# Patient Record
Sex: Female | Born: 1970 | Race: Black or African American | Hispanic: No | Marital: Single | Smoking: Never smoker
Health system: Southern US, Community
[De-identification: ages and names within clinical notes are randomized; demographics above are authoritative.]

## PROBLEM LIST (undated history)

## (undated) HISTORY — PX: INDUCED ABORTION: SHX677

---

## 2014-02-09 ENCOUNTER — Encounter (HOSPITAL_BASED_OUTPATIENT_CLINIC_OR_DEPARTMENT_OTHER): Payer: Self-pay | Admitting: Emergency Medicine

## 2014-02-09 ENCOUNTER — Emergency Department (HOSPITAL_BASED_OUTPATIENT_CLINIC_OR_DEPARTMENT_OTHER)
Admission: EM | Admit: 2014-02-09 | Discharge: 2014-02-09 | Disposition: A | Discharge status: Home / Self Care | Payer: Self-pay | Attending: Emergency Medicine | Admitting: Emergency Medicine

## 2014-02-09 ENCOUNTER — Emergency Department (HOSPITAL_BASED_OUTPATIENT_CLINIC_OR_DEPARTMENT_OTHER): Payer: Self-pay

## 2014-02-09 DIAGNOSIS — D649 Anemia, unspecified: Secondary | ICD-10-CM | POA: Insufficient documentation

## 2014-02-09 DIAGNOSIS — Z3202 Encounter for pregnancy test, result negative: Secondary | ICD-10-CM | POA: Insufficient documentation

## 2014-02-09 DIAGNOSIS — R109 Unspecified abdominal pain: Secondary | ICD-10-CM

## 2014-02-09 DIAGNOSIS — R112 Nausea with vomiting, unspecified: Secondary | ICD-10-CM | POA: Insufficient documentation

## 2014-02-09 DIAGNOSIS — K429 Umbilical hernia without obstruction or gangrene: Secondary | ICD-10-CM | POA: Insufficient documentation

## 2014-02-09 LAB — COMPREHENSIVE METABOLIC PANEL
ALT: 7 U/L (ref 0–35)
AST: 12 U/L (ref 0–37)
Albumin: 3.9 g/dL (ref 3.5–5.2)
Alkaline Phosphatase: 64 U/L (ref 39–117)
BUN: 6 mg/dL (ref 6–23)
CHLORIDE: 98 meq/L (ref 96–112)
CO2: 28 meq/L (ref 19–32)
CREATININE: 0.7 mg/dL (ref 0.50–1.10)
Calcium: 9.6 mg/dL (ref 8.4–10.5)
GFR calc Af Amer: >90 mL/min (ref >90)
GLUCOSE: 101 mg/dL — AB (ref 70–99)
Potassium: 3.2 mEq/L — ABNORMAL LOW (ref 3.7–5.3)
SODIUM: 139 meq/L (ref 137–147)
Total Bilirubin: 0.6 mg/dL (ref 0.3–1.2)
Total Protein: 7.8 g/dL (ref 6.0–8.3)

## 2014-02-09 LAB — CBC WITH DIFFERENTIAL/PLATELET
BASOS ABS: 0 10*3/uL (ref 0.0–0.1)
Basophils Relative: 0 % (ref 0–1)
EOS PCT: 0 % (ref 0–5)
Eosinophils Absolute: 0 10*3/uL (ref 0.0–0.7)
HEMATOCRIT: 29.4 % — AB (ref 36.0–46.0)
Hemoglobin: 8.5 g/dL — ABNORMAL LOW (ref 12.0–15.0)
LYMPHS ABS: 2.2 10*3/uL (ref 0.7–4.0)
Lymphocytes Relative: 28 % (ref 12–46)
MCH: 21.1 pg — ABNORMAL LOW (ref 26.0–34.0)
MCHC: 28.9 g/dL — ABNORMAL LOW (ref 30.0–36.0)
MCV: 73.1 fL — AB (ref 78.0–100.0)
Monocytes Absolute: 0.7 10*3/uL (ref 0.1–1.0)
Monocytes Relative: 9 % (ref 3–12)
NEUTROS ABS: 4.9 10*3/uL (ref 1.7–7.7)
Neutrophils Relative %: 63 % (ref 43–77)
Platelets: 571 10*3/uL — ABNORMAL HIGH (ref 150–400)
RBC: 4.02 MIL/uL (ref 3.87–5.11)
RDW: 17.8 % — AB (ref 11.5–15.5)
WBC: 7.8 10*3/uL (ref 4.0–10.5)

## 2014-02-09 LAB — URINALYSIS, ROUTINE W REFLEX MICROSCOPIC
Bilirubin Urine: NEGATIVE
GLUCOSE, UA: NEGATIVE mg/dL
HGB URINE DIPSTICK: NEGATIVE
KETONES UR: NEGATIVE mg/dL
Nitrite: NEGATIVE
PROTEIN: NEGATIVE mg/dL
Specific Gravity, Urine: 1.02 (ref 1.005–1.030)
Urobilinogen, UA: 1 mg/dL (ref 0.0–1.0)
pH: 5.5 (ref 5.0–8.0)

## 2014-02-09 LAB — URINE MICROSCOPIC-ADD ON

## 2014-02-09 LAB — LIPASE, BLOOD: Lipase: 26 U/L (ref 11–59)

## 2014-02-09 LAB — PREGNANCY, URINE: Preg Test, Ur: NEGATIVE

## 2014-02-09 MED ORDER — PROMETHAZINE HCL 25 MG PO TABS: 25.0000 mg | ORAL_TABLET | Freq: Four times a day (QID) | ORAL | Status: AC | PRN

## 2014-02-09 MED ORDER — HYDROMORPHONE HCL PF 1 MG/ML IJ SOLN
0.5000 mg | Freq: Once | INTRAMUSCULAR | Status: AC
  Administered 2014-02-09: 0.5 mg via INTRAVENOUS
  Filled 2014-02-09: qty 1

## 2014-02-09 MED ORDER — PROMETHAZINE HCL 25 MG RE SUPP: 25.0000 mg | Freq: Four times a day (QID) | RECTAL | Status: AC | PRN

## 2014-02-09 MED ORDER — PROMETHAZINE HCL 25 MG/ML IJ SOLN
25.0000 mg | Freq: Once | INTRAMUSCULAR | Status: AC
  Administered 2014-02-09: 25 mg via INTRAVENOUS
  Filled 2014-02-09: qty 1

## 2014-02-09 MED ORDER — SODIUM CHLORIDE 0.9 % IV BOLUS (SEPSIS)
1000.0000 mL | Freq: Once | INTRAVENOUS | Status: AC
  Administered 2014-02-09: 1000 mL via INTRAVENOUS

## 2014-02-09 NOTE — ED Provider Notes (Signed)
CSN: [id number]     Arrival date & time 02/09/14  1831 History   First MD Initiated Contact with Patient 02/09/14 1855     Chief Complaint  Patient presents with  . Abdominal Pain     (Consider location/radiation/quality/duration/timing/severity/associated sxs/prior Treatment) HPI Comments: Patent here with left flank and mid abdominal pain since this past Friday - she states that initially the pain was located around her umbilicus and her known umbilical hernia, she states she began then with nausea and vomiting.  She was seen at [facility] Saturday night for this, blood work revealed anemia and CT scan showed omentum and fat in the hernia with some stranding but no bowel.  She denies diarrhea or constipation.  She also denies fever, chills, dysuria, hematuria, vaginal discharge or bleeding.  She reports no history of sick contacts, she was unable to get the zofran filled because of the cost and has not been taking the pain medication they gave her because she knew she would not be able to keep this down.  She reports that she has been able to keep down sips of gingerale and broth today but no solid food - she reports trying crackers this morning which she vomited.  She denies blood in the vomit.  Patient is a 43 y.o. female presenting with abdominal pain. The history is provided by the patient. No language interpreter was used.  Abdominal Pain   History reviewed. No pertinent past medical history. Past Surgical History  Procedure Laterality Date  . Cesarean section    . Induced abortion     History reviewed. No pertinent family history. History  Substance Use Topics  . Smoking status: Never Smoker   . Smokeless tobacco: Not on file  . Alcohol Use: No   OB History   Grav Para Term Preterm Abortions TAB SAB Ect Mult Living                 Review of Systems  Gastrointestinal: Positive for abdominal pain.  All other systems reviewed and are negative.      Allergies  Review of  patient's allergies indicates no known allergies.  Home Medications   Current Outpatient Rx  Name  Route  Sig  Dispense  Refill  . ondansetron (ZOFRAN) 4 MG tablet   Oral   Take 4 mg by mouth every 8 (eight) hours as needed for nausea or vomiting.         . promethazine (PHENERGAN) 25 MG suppository   Rectal   Place 1 suppository (25 mg total) rectally every 6 (six) hours as needed for nausea or vomiting.   12 each   0   . promethazine (PHENERGAN) 25 MG tablet   Oral   Take 1 tablet (25 mg total) by mouth every 6 (six) hours as needed for nausea or vomiting.   30 tablet   0    BP 130/70  Pulse 82  Temp(Src) 98.9 F (37.2 C) (Oral)  Resp 16  Ht 5\' 2"  (1.575 m)  Wt 247 lb (112.038 kg)  BMI 45.17 kg/m2  SpO2 99%  LMP 01/31/2014 Physical Exam  Nursing note and vitals reviewed. Constitutional: She is oriented to person, place, and time. She appears well-developed and well-nourished.  Uncomfortable appearing.  HENT:  Head: Normocephalic and atraumatic.  Right Ear: External ear normal.  Left Ear: External ear normal.  Nose: Nose normal.  Mouth/Throat: Oropharynx is clear and moist. No oropharyngeal exudate.  Eyes: Conjunctivae are normal. Pupils are equal,  round, and reactive to light. No scleral icterus.  Neck: Normal range of motion. Neck supple.  Cardiovascular: Normal rate, regular rhythm and normal heart sounds.  Exam reveals no gallop and no friction rub.   No murmur heard. Pulmonary/Chest: Effort normal and breath sounds normal. No respiratory distress. She has no wheezes. She has no rales. She exhibits no tenderness.  Abdominal: Soft. Bowel sounds are normal. She exhibits mass. She exhibits no distension. There is tenderness. There is CVA tenderness. There is no rebound and no guarding. A hernia is present. Hernia confirmed positive in the ventral area.    Musculoskeletal: Normal range of motion. She exhibits no edema and no tenderness.  Lymphadenopathy:    She  has no cervical adenopathy.  Neurological: She is alert and oriented to person, place, and time. She exhibits normal muscle tone. Coordination normal.  Skin: Skin is warm and dry. No rash noted. No erythema. No pallor.  Psychiatric: She has a normal mood and affect. Her behavior is normal. Judgment and thought content normal.    ED Course  Procedures (including critical care time) Labs Review Labs Reviewed  CBC WITH DIFFERENTIAL - Abnormal; Notable for the following:    Hemoglobin 8.5 (*)    HCT 29.4 (*)    MCV 73.1 (*)    MCH 21.1 (*)    MCHC 28.9 (*)    RDW 17.8 (*)    Platelets 571 (*)    All other components within normal limits  COMPREHENSIVE METABOLIC PANEL - Abnormal; Notable for the following:    Potassium 3.2 (*)    Glucose, Bld 101 (*)    All other components within normal limits  URINALYSIS, ROUTINE W REFLEX MICROSCOPIC - Abnormal; Notable for the following:    APPearance CLOUDY (*)    Leukocytes, UA SMALL (*)    All other components within normal limits  URINE MICROSCOPIC-ADD ON - Abnormal; Notable for the following:    Squamous Epithelial / LPF MANY (*)    Bacteria, UA FEW (*)    All other components within normal limits  LIPASE, BLOOD  PREGNANCY, URINE   Imaging Review Dg Abd Acute W/chest  02/09/2014   CLINICAL DATA:  Left lower quadrant pain  EXAM: ACUTE ABDOMEN SERIES (ABDOMEN 2 VIEW & CHEST 1 VIEW)  COMPARISON:  None.  FINDINGS: There is no evidence of dilated bowel loops or free intraperitoneal air. Residual contrast is identified within the colon. Marland Kitchen Heart size and mediastinal contours are stable. The heart size is enlarged. Both lungs are clear.  IMPRESSION: No bowel obstruction or free air.  No acute cardiopulmonary disease.   Electronically Signed   By: Sherian Rein M.D.   On: 02/09/2014 20:42    EKG Interpretation   None      Results for orders placed during the hospital encounter of 02/09/14  CBC WITH DIFFERENTIAL      Result Value Range   WBC  7.8  4.0 - 10.5 K/uL   RBC 4.02  3.87 - 5.11 MIL/uL   Hemoglobin 8.5 (*) 12.0 - 15.0 g/dL   HCT 16.1 (*) 09.6 - 04.5 %   MCV 73.1 (*) 78.0 - 100.0 fL   MCH 21.1 (*) 26.0 - 34.0 pg   MCHC 28.9 (*) 30.0 - 36.0 g/dL   RDW 40.9 (*) 81.1 - 91.4 %   Platelets 571 (*) 150 - 400 K/uL   Neutrophils Relative % 63  43 - 77 %   Lymphocytes Relative 28  12 - 46 %  Monocytes Relative 9  3 - 12 %   Eosinophils Relative 0  0 - 5 %   Basophils Relative 0  0 - 1 %   Neutro Abs 4.9  1.7 - 7.7 K/uL   Lymphs Abs 2.2  0.7 - 4.0 K/uL   Monocytes Absolute 0.7  0.1 - 1.0 K/uL   Eosinophils Absolute 0.0  0.0 - 0.7 K/uL   Basophils Absolute 0.0  0.0 - 0.1 K/uL  COMPREHENSIVE METABOLIC PANEL      Result Value Range   Sodium 139  137 - 147 mEq/L   Potassium 3.2 (*) 3.7 - 5.3 mEq/L   Chloride 98  96 - 112 mEq/L   CO2 28  19 - 32 mEq/L   Glucose, Bld 101 (*) 70 - 99 mg/dL   BUN 6  6 - 23 mg/dL   Creatinine, Ser 4.09  0.50 - 1.10 mg/dL   Calcium 9.6  8.4 - 81.1 mg/dL   Total Protein 7.8  6.0 - 8.3 g/dL   Albumin 3.9  3.5 - 5.2 g/dL   AST 12  0 - 37 U/L   ALT 7  0 - 35 U/L   Alkaline Phosphatase 64  39 - 117 U/L   Total Bilirubin 0.6  0.3 - 1.2 mg/dL   GFR calc non Af Amer >90  >90 mL/min   GFR calc Af Amer >90  >90 mL/min  LIPASE, BLOOD      Result Value Range   Lipase 26  11 - 59 U/L  URINALYSIS, ROUTINE W REFLEX MICROSCOPIC      Result Value Range   Color, Urine YELLOW  YELLOW   APPearance CLOUDY (*) CLEAR   Specific Gravity, Urine 1.020  1.005 - 1.030   pH 5.5  5.0 - 8.0   Glucose, UA NEGATIVE  NEGATIVE mg/dL   Hgb urine dipstick NEGATIVE  NEGATIVE   Bilirubin Urine NEGATIVE  NEGATIVE   Ketones, ur NEGATIVE  NEGATIVE mg/dL   Protein, ur NEGATIVE  NEGATIVE mg/dL   Urobilinogen, UA 1.0  0.0 - 1.0 mg/dL   Nitrite NEGATIVE  NEGATIVE   Leukocytes, UA SMALL (*) NEGATIVE  PREGNANCY, URINE      Result Value Range   Preg Test, Ur NEGATIVE  NEGATIVE  URINE MICROSCOPIC-ADD ON      Result Value  Range   Squamous Epithelial / LPF MANY (*) RARE   WBC, UA 3-6  <3 WBC/hpf   Bacteria, UA FEW (*) RARE   Dg Abd Acute W/chest  02/09/2014   CLINICAL DATA:  Left lower quadrant pain  EXAM: ACUTE ABDOMEN SERIES (ABDOMEN 2 VIEW & CHEST 1 VIEW)  COMPARISON:  None.  FINDINGS: There is no evidence of dilated bowel loops or free intraperitoneal air. Residual contrast is identified within the colon. Marland Kitchen Heart size and mediastinal contours are stable. The heart size is enlarged. Both lungs are clear.  IMPRESSION: No bowel obstruction or free air.  No acute cardiopulmonary disease.   Electronically Signed   By: Sherian Rein M.D.   On: 02/09/2014 20:42     MDM   Final diagnoses:  Anemia  Umbilical hernia  Abdominal pain    Patient here with left flank and abdominal pain - we have gotten results from [facility], besides the anemia and the hernia, I do not suspect appendicitis, cholecystitis, inflammatory bowel, kidney stone, pyelonephritis.  This is likely gastroenteritis, the patient has pain medication at home, she could not afford the zofran so I have written  for phenergan at this time.  She agrees with the plan and will follow up with her PCP and the surgeon she was referred to.    Izola Price Marisue Humble, [place] 02/09/14 2153

## 2014-02-09 NOTE — ED Provider Notes (Signed)
Medical screening examination/treatment/procedure(s) were performed by non-physician practitioner and as supervising physician I was immediately available for consultation/collaboration.  EKG Interpretation   None         Gracelynne Benedict, MD 02/09/14 2239 

## 2014-02-09 NOTE — ED Notes (Signed)
Pt reports abd pain seen at [facility] for same on sat ct scan neg

## 2014-02-09 NOTE — ED Notes (Signed)
Patient transported to X-ray 

## 2014-02-09 NOTE — ED Notes (Signed)
Patient transported to CT 

## 2014-02-09 NOTE — Discharge Instructions (Signed)
Abdominal Pain, Women °Abdominal (stomach, pelvic, or belly) pain can be caused by many things. It is important to tell your doctor: °· The location of the pain. °· Does it come and go or is it present all the time? °· Are there things that start the pain (eating certain foods, exercise)? °· Are there other symptoms associated with the pain (fever, nausea, vomiting, diarrhea)? °All of this is helpful to know when trying to find the cause of the pain. °CAUSES  °· Stomach: virus or bacteria infection, or ulcer. °· Intestine: appendicitis (inflamed appendix), regional ileitis (Crohn's disease), ulcerative colitis (inflamed colon), irritable bowel syndrome, diverticulitis (inflamed diverticulum of the colon), or cancer of the stomach or intestine. °· Gallbladder disease or stones in the gallbladder. °· Kidney disease, kidney stones, or infection. °· Pancreas infection or cancer. °· Fibromyalgia (pain disorder). °· Diseases of the female organs: °· Uterus: fibroid (non-cancerous) tumors or infection. °· Fallopian tubes: infection or tubal pregnancy. °· Ovary: cysts or tumors. °· Pelvic adhesions (scar tissue). °· Endometriosis (uterus lining tissue growing in the pelvis and on the pelvic organs). °· Pelvic congestion syndrome (female organs filling up with blood just before the menstrual period). °· Pain with the menstrual period. °· Pain with ovulation (producing an egg). °· Pain with an IUD (intrauterine device, birth control) in the uterus. °· Cancer of the female organs. °· Functional pain (pain not caused by a disease, may improve without treatment). °· Psychological pain. °· Depression. °DIAGNOSIS  °Your doctor will decide the seriousness of your pain by doing an examination. °· Blood tests. °· X-rays. °· Ultrasound. °· CT scan (computed tomography, special type of X-ray). °· MRI (magnetic resonance imaging). °· Cultures, for infection. °· Barium enema (dye inserted in the large intestine, to better view it with  X-rays). °· Colonoscopy (looking in intestine with a lighted tube). °· Laparoscopy (minor surgery, looking in abdomen with a lighted tube). °· Major abdominal exploratory surgery (looking in abdomen with a large incision). °TREATMENT  °The treatment will depend on the cause of the pain.  °· Many cases can be observed and treated at home. °· Over-the-counter medicines recommended by your caregiver. °· Prescription medicine. °· Antibiotics, for infection. °· Birth control pills, for painful periods or for ovulation pain. °· Hormone treatment, for endometriosis. °· Nerve blocking injections. °· Physical therapy. °· Antidepressants. °· Counseling with a psychologist or psychiatrist. °· Minor or major surgery. °HOME CARE INSTRUCTIONS  °· Do not take laxatives, unless directed by your caregiver. °· Take over-the-counter pain medicine only if ordered by your caregiver. Do not take aspirin because it can cause an upset stomach or bleeding. °· Try a clear liquid diet (broth or water) as ordered by your caregiver. Slowly move to a bland diet, as tolerated, if the pain is related to the stomach or intestine. °· Have a thermometer and take your temperature several times a day, and record it. °· Bed rest and sleep, if it helps the pain. °· Avoid sexual intercourse, if it causes pain. °· Avoid stressful situations. °· Keep your follow-up appointments and tests, as your caregiver orders. °· If the pain does not go away with medicine or surgery, you may try: °· Acupuncture. °· Relaxation exercises (yoga, meditation). °· Group therapy. °· Counseling. °SEEK MEDICAL CARE IF:  °· You notice certain foods cause stomach pain. °· Your home care treatment is not helping your pain. °· You need stronger pain medicine. °· You want your IUD removed. °· You feel faint or   lightheaded.  You develop nausea and vomiting.  You develop a rash.  You are having side effects or an allergy to your medicine. SEEK IMMEDIATE MEDICAL CARE IF:   Your  pain does not go away or gets worse.  You have a fever.  Your pain is felt only in portions of the abdomen. The right side could possibly be appendicitis. The left lower portion of the abdomen could be colitis or diverticulitis.  You are passing blood in your stools (bright red or black tarry stools, with or without vomiting).  You have blood in your urine.  You develop chills, with or without a fever.  You pass out. MAKE SURE YOU:   Understand these instructions.  Will watch your condition.  Will get help right away if you are not doing well or get worse. Document Released: 10/14/2007 Document Revised: 03/10/2012 Document Reviewed: 11/03/2009 [facility] Patient Information 2014 [place], [place].  Anemia, Nonspecific Anemia is a condition in which the concentration of red blood cells or hemoglobin in the blood is below normal. Hemoglobin is a substance in red blood cells that carries oxygen to the tissues of the body. Anemia results in not enough oxygen reaching these tissues.  CAUSES  Common causes of anemia include:   Excessive bleeding. Bleeding may be internal or external. This includes excessive bleeding from periods (in women) or from the intestine.   Poor nutrition.   Chronic kidney, thyroid, and liver disease.  Bone marrow disorders that decrease red blood cell production.  Cancer and treatments for cancer.  HIV, AIDS, and their treatments.  Spleen problems that increase red blood cell destruction.  Blood disorders.  Excess destruction of red blood cells due to infection, medicines, and autoimmune disorders. SIGNS AND SYMPTOMS   Minor weakness.   Dizziness.   Headache.  Palpitations.   Shortness of breath, especially with exercise.   Paleness.  Cold sensitivity.  Indigestion.  Nausea.  Difficulty sleeping.  Difficulty concentrating. Symptoms may occur suddenly or they may develop slowly.  DIAGNOSIS  Additional blood tests are often  needed. These help your health care provider determine the best treatment. Your health care provider will check your stool for blood and look for other causes of blood loss.  TREATMENT  Treatment varies depending on the cause of the anemia. Treatment can include:   Supplements of iron, vitamin V37, or folic acid.   Hormone medicines.   A blood transfusion. This may be needed if blood loss is severe.   Hospitalization. This may be needed if there is significant continual blood loss.   Dietary changes.  Spleen removal. HOME CARE INSTRUCTIONS Keep all follow-up appointments. It often takes many weeks to correct anemia, and having your health care provider check on your condition and your response to treatment is very important. SEEK IMMEDIATE MEDICAL CARE IF:   You develop extreme weakness, shortness of breath, or chest pain.   You become dizzy or have trouble concentrating.  You develop heavy vaginal bleeding.   You develop a rash.   You have bloody or black, tarry stools.   You faint.   You vomit up blood.   You vomit repeatedly.   You have abdominal pain.  You have a fever or persistent symptoms for more than 2 3 days.   You have a fever and your symptoms suddenly get worse.   You are dehydrated.  MAKE SURE YOU:  Understand these instructions.  Will watch your condition.  Will get help right away if you are  not doing well or get worse. Document Released: 01/24/2005 Document Revised: 08/19/2013 Document Reviewed: 06/12/2013 [facility] Patient Information 2014 [place], [place].  Hernia A hernia occurs when an internal organ pushes out through a weak spot in the abdominal wall. Hernias most commonly occur in the groin and around the navel. Hernias often can be pushed back into place (reduced). Most hernias tend to get worse over time. Some abdominal hernias can get stuck in the opening (irreducible or incarcerated hernia) and cannot be reduced. An  irreducible abdominal hernia which is tightly squeezed into the opening is at risk for impaired blood supply (strangulated hernia). A strangulated hernia is a medical emergency. Because of the risk for an irreducible or strangulated hernia, surgery may be recommended to repair a hernia. CAUSES   Heavy lifting.  Prolonged coughing.  Straining to have a bowel movement.  A cut (incision) made during an abdominal surgery. HOME CARE INSTRUCTIONS   Bed rest is not required. You may continue your normal activities.  Avoid lifting more than 10 pounds (4.5 kg) or straining.  Cough gently. If you are a smoker it is best to stop. Even the best hernia repair can break down with the continual strain of coughing. Even if you do not have your hernia repaired, a cough will continue to aggravate the problem.  Do not wear anything tight over your hernia. Do not try to keep it in with an outside bandage or truss. These can damage abdominal contents if they are trapped within the hernia sac.  Eat a normal diet.  Avoid constipation. Straining over long periods of time will increase hernia size and encourage breakdown of repairs. If you cannot do this with diet alone, stool softeners may be used. SEEK IMMEDIATE MEDICAL CARE IF:   You have a fever.  You develop increasing abdominal pain.  You feel nauseous or vomit.  Your hernia is stuck outside the abdomen, looks discolored, feels hard, or is tender.  You have any changes in your bowel habits or in the hernia that are unusual for you.  You have increased pain or swelling around the hernia.  You cannot push the hernia back in place by applying gentle pressure while lying down. MAKE SURE YOU:   Understand these instructions.  Will watch your condition.  Will get help right away if you are not doing well or get worse. Document Released: 12/17/2005 Document Revised: 03/10/2012 Document Reviewed: 08/05/2008 [facility] Patient Information 2014  [place], [place].

## 2015-06-12 IMAGING — CR DG ABDOMEN ACUTE W/ 1V CHEST
1 series · 1 of 1 positions shown · non-contrast
Comparison: None.

CLINICAL DATA: Left lower quadrant pain

EXAM:
ACUTE ABDOMEN SERIES (ABDOMEN 2 VIEW & CHEST 1 VIEW)

[view not recorded]
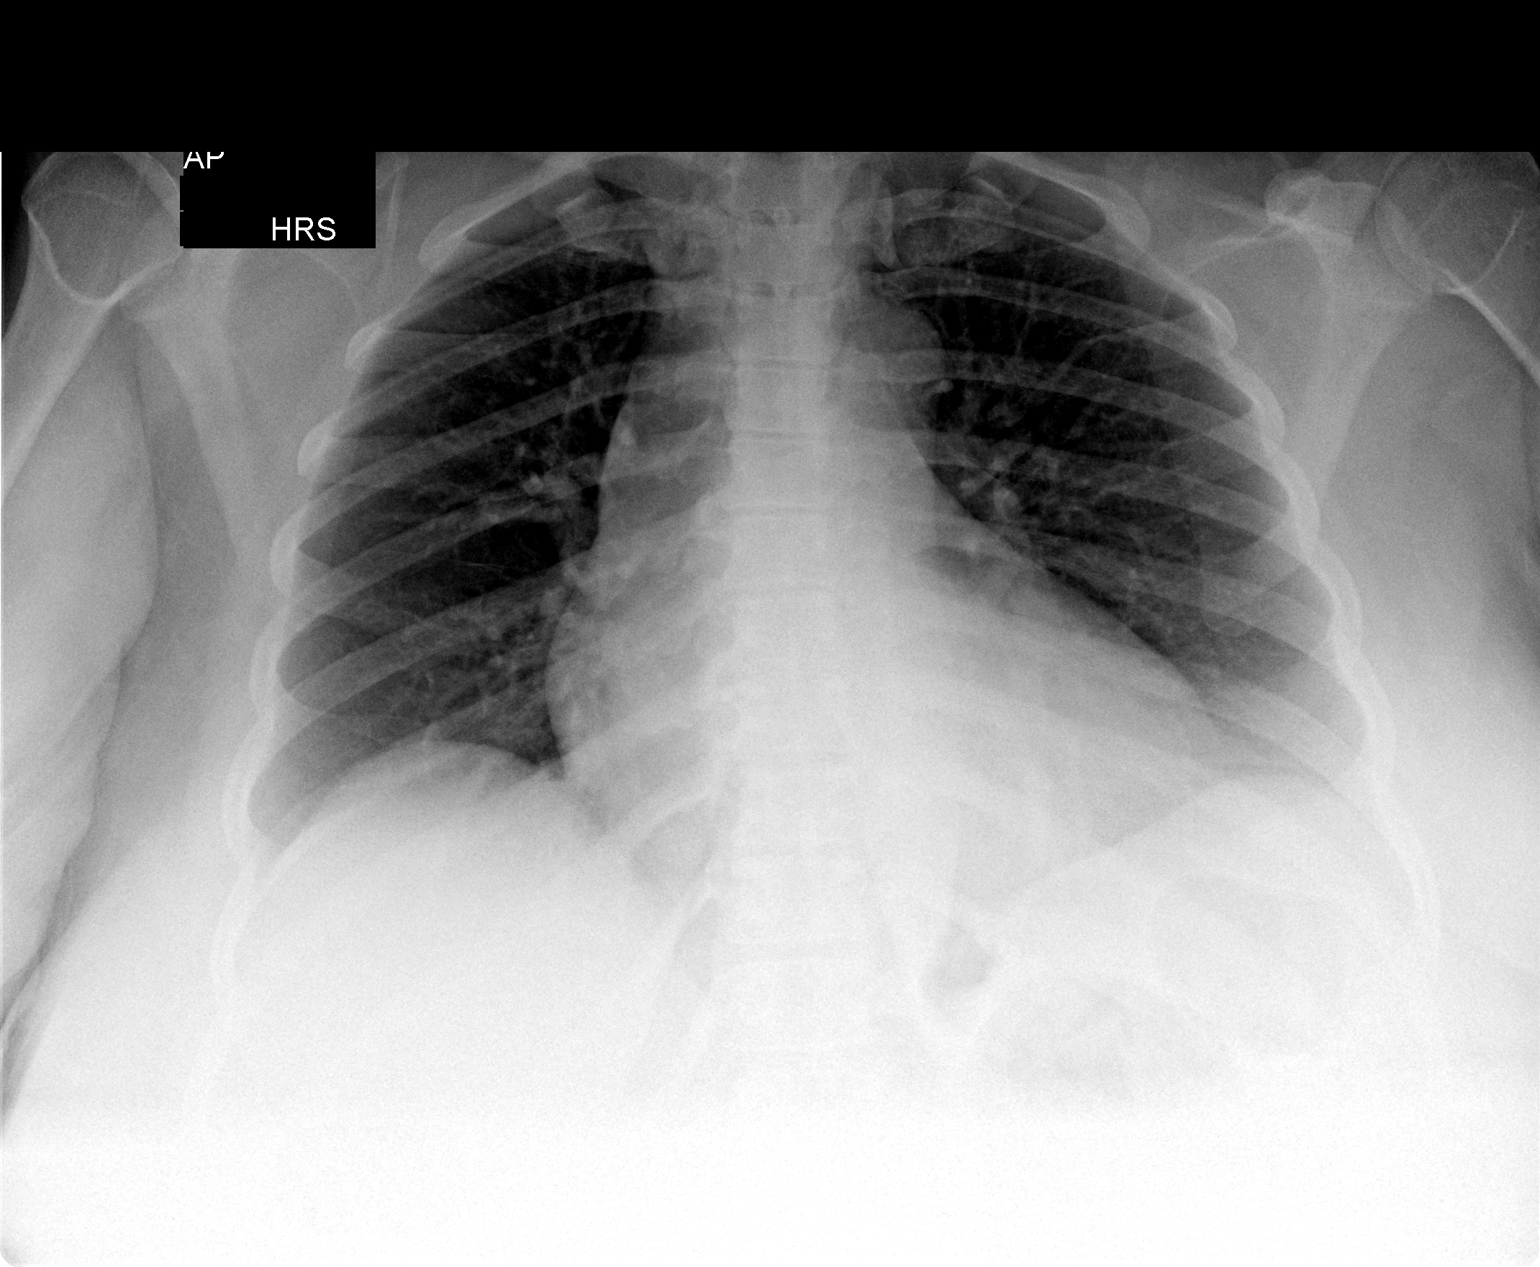

[1 of 1 positions shown; findings below may reference images not displayed]

FINDINGS: There is no evidence of dilated bowel loops or free intraperitoneal
air. Residual contrast is identified within the colon. . Heart size
and mediastinal contours are stable. The heart size is enlarged.
Both lungs are clear.
IMPRESSION: No bowel obstruction or free air.  No acute cardiopulmonary disease.
